# Patient Record
Sex: Male | Born: 1940 | Race: White | Hispanic: No | Marital: Married | Smoking: Never smoker
Health system: Southern US, Community
[De-identification: ages and names within clinical notes are randomized; demographics above are authoritative.]

## PROBLEM LIST (undated history)

## (undated) DIAGNOSIS — I10 Essential (primary) hypertension: Secondary | ICD-10-CM

## (undated) HISTORY — PX: MENISCUS REPAIR: SHX5179

---

## 1999-01-13 ENCOUNTER — Emergency Department (HOSPITAL_COMMUNITY): Admission: EM | Admit: 1999-01-13 | Discharge: 1999-01-13 | Payer: Self-pay | Admitting: Emergency Medicine

## 1999-01-13 ENCOUNTER — Encounter: Payer: Self-pay | Admitting: Emergency Medicine

## 2011-06-26 DIAGNOSIS — Z79899 Other long term (current) drug therapy: Secondary | ICD-10-CM | POA: Diagnosis not present

## 2011-06-26 DIAGNOSIS — I10 Essential (primary) hypertension: Secondary | ICD-10-CM | POA: Diagnosis not present

## 2011-06-26 DIAGNOSIS — Z125 Encounter for screening for malignant neoplasm of prostate: Secondary | ICD-10-CM | POA: Diagnosis not present

## 2011-07-03 DIAGNOSIS — D72819 Decreased white blood cell count, unspecified: Secondary | ICD-10-CM | POA: Diagnosis not present

## 2011-07-03 DIAGNOSIS — I10 Essential (primary) hypertension: Secondary | ICD-10-CM | POA: Diagnosis not present

## 2011-12-30 DIAGNOSIS — Z23 Encounter for immunization: Secondary | ICD-10-CM | POA: Diagnosis not present

## 2012-03-31 DIAGNOSIS — I10 Essential (primary) hypertension: Secondary | ICD-10-CM | POA: Diagnosis not present

## 2012-06-09 DIAGNOSIS — H353190 Nonexudative age-related macular degeneration, unspecified eye, stage unspecified: Secondary | ICD-10-CM | POA: Diagnosis not present

## 2012-06-09 DIAGNOSIS — H43819 Vitreous degeneration, unspecified eye: Secondary | ICD-10-CM | POA: Diagnosis not present

## 2012-08-05 DIAGNOSIS — Z125 Encounter for screening for malignant neoplasm of prostate: Secondary | ICD-10-CM | POA: Diagnosis not present

## 2012-08-05 DIAGNOSIS — Z79899 Other long term (current) drug therapy: Secondary | ICD-10-CM | POA: Diagnosis not present

## 2012-08-05 DIAGNOSIS — I10 Essential (primary) hypertension: Secondary | ICD-10-CM | POA: Diagnosis not present

## 2012-08-12 DIAGNOSIS — Z0000 Encounter for general adult medical examination without abnormal findings: Secondary | ICD-10-CM | POA: Diagnosis not present

## 2013-08-24 DIAGNOSIS — Z0000 Encounter for general adult medical examination without abnormal findings: Secondary | ICD-10-CM | POA: Diagnosis not present

## 2013-08-24 DIAGNOSIS — I10 Essential (primary) hypertension: Secondary | ICD-10-CM | POA: Diagnosis not present

## 2013-08-24 DIAGNOSIS — E785 Hyperlipidemia, unspecified: Secondary | ICD-10-CM | POA: Diagnosis not present

## 2013-08-24 DIAGNOSIS — Z23 Encounter for immunization: Secondary | ICD-10-CM | POA: Diagnosis not present

## 2013-08-24 DIAGNOSIS — Z125 Encounter for screening for malignant neoplasm of prostate: Secondary | ICD-10-CM | POA: Diagnosis not present

## 2013-11-20 ENCOUNTER — Emergency Department (HOSPITAL_COMMUNITY): Payer: Medicare Other

## 2013-11-20 ENCOUNTER — Encounter (HOSPITAL_COMMUNITY): Payer: Self-pay | Admitting: Emergency Medicine

## 2013-11-20 ENCOUNTER — Emergency Department (HOSPITAL_COMMUNITY)
Admission: EM | Admit: 2013-11-20 | Discharge: 2013-11-20 | Disposition: A | Discharge status: Home / Self Care | Payer: Medicare Other | Attending: Emergency Medicine | Admitting: Emergency Medicine

## 2013-11-20 DIAGNOSIS — IMO0002 Reserved for concepts with insufficient information to code with codable children: Secondary | ICD-10-CM | POA: Diagnosis not present

## 2013-11-20 DIAGNOSIS — Z23 Encounter for immunization: Secondary | ICD-10-CM | POA: Insufficient documentation

## 2013-11-20 DIAGNOSIS — X500XXA Overexertion from strenuous movement or load, initial encounter: Secondary | ICD-10-CM | POA: Insufficient documentation

## 2013-11-20 DIAGNOSIS — Z880 Allergy status to penicillin: Secondary | ICD-10-CM | POA: Diagnosis not present

## 2013-11-20 DIAGNOSIS — S92002A Unspecified fracture of left calcaneus, initial encounter for closed fracture: Secondary | ICD-10-CM

## 2013-11-20 DIAGNOSIS — S92009A Unspecified fracture of unspecified calcaneus, initial encounter for closed fracture: Secondary | ICD-10-CM | POA: Diagnosis not present

## 2013-11-20 DIAGNOSIS — S92919A Unspecified fracture of unspecified toe(s), initial encounter for closed fracture: Secondary | ICD-10-CM | POA: Insufficient documentation

## 2013-11-20 DIAGNOSIS — S8990XA Unspecified injury of unspecified lower leg, initial encounter: Secondary | ICD-10-CM | POA: Insufficient documentation

## 2013-11-20 DIAGNOSIS — Y9289 Other specified places as the place of occurrence of the external cause: Secondary | ICD-10-CM | POA: Diagnosis not present

## 2013-11-20 DIAGNOSIS — Z7982 Long term (current) use of aspirin: Secondary | ICD-10-CM | POA: Insufficient documentation

## 2013-11-20 DIAGNOSIS — S99919A Unspecified injury of unspecified ankle, initial encounter: Secondary | ICD-10-CM | POA: Diagnosis not present

## 2013-11-20 DIAGNOSIS — Y9389 Activity, other specified: Secondary | ICD-10-CM | POA: Diagnosis not present

## 2013-11-20 DIAGNOSIS — Z79899 Other long term (current) drug therapy: Secondary | ICD-10-CM | POA: Insufficient documentation

## 2013-11-20 DIAGNOSIS — I10 Essential (primary) hypertension: Secondary | ICD-10-CM | POA: Diagnosis not present

## 2013-11-20 DIAGNOSIS — S99929A Unspecified injury of unspecified foot, initial encounter: Secondary | ICD-10-CM

## 2013-11-20 HISTORY — DX: Essential (primary) hypertension: I10

## 2013-11-20 MED ORDER — BACITRACIN ZINC 500 UNIT/GM EX OINT
TOPICAL_OINTMENT | CUTANEOUS | Status: AC
  Filled 2013-11-20: qty 1.8

## 2013-11-20 MED ORDER — BACITRACIN 500 UNIT/GM EX OINT
1.0000 "application " | TOPICAL_OINTMENT | Freq: Two times a day (BID) | CUTANEOUS | Status: DC
  Administered 2013-11-20: 1 via TOPICAL
  Filled 2013-11-20 (×2): qty 0.9

## 2013-11-20 MED ORDER — HYDROCODONE-ACETAMINOPHEN 5-325 MG PO TABS: 1.0000 | ORAL_TABLET | ORAL | Status: AC | PRN

## 2013-11-20 MED ORDER — TETANUS-DIPHTH-ACELL PERTUSSIS 5-2.5-18.5 LF-MCG/0.5 IM SUSP
0.5000 mL | Freq: Once | INTRAMUSCULAR | Status: AC
  Administered 2013-11-20: 0.5 mL via INTRAMUSCULAR
  Filled 2013-11-20: qty 0.5

## 2013-11-20 NOTE — ED Notes (Signed)
Pt states that he fell off a ladder yesterday.  States that he "banged the top of his left foot".  C/o pain and swelling.  Pt states that he did not hit his head and is not c/o any other problems.

## 2013-11-20 NOTE — ED Notes (Signed)
Awaiting for Ortho Tech Quinton arrival. Once splint is placed will discharge patient.

## 2013-11-20 NOTE — ED Provider Notes (Signed)
CSN: [id number]     Arrival date & time 11/20/13  0732 History   First MD Initiated Contact with Patient 11/20/13 [phone]     Chief Complaint  Patient presents with  . Foot Pain     (Consider location/radiation/quality/duration/timing/severity/associated sxs/prior Treatment) HPI Comments: Patient presents with left foot pain. He states yesterday he was up on a step stool and when he was coming down, he tripped and twisted his left foot. He also bumped it on the top of the step. He's had a hard time bearing weight since the incident. It was markedly more swollen this morning. He has a scratch on his right leg. He denies any other injuries. He did not hit his head. There is no neck or back pain. He's not sure when his last tetanus shot was but feels it was more than 5 years ago.  Patient is a 73 y.o. male presenting with lower extremity pain.  Foot Pain Pertinent negatives include no headaches.    Past Medical History  Diagnosis Date  . Hypertension    Past Surgical History  Procedure Laterality Date  . Meniscus repair     History reviewed. No pertinent family history. History  Substance Use Topics  . Smoking status: Never Smoker   . Smokeless tobacco: Not on file  . Alcohol Use: 0.6 oz/week    1 Glasses of wine per week    Review of Systems  Constitutional: Negative for fever.  Gastrointestinal: Negative for nausea and vomiting.  Musculoskeletal: Positive for arthralgias and joint swelling. Negative for back pain and neck pain.  Skin: Positive for wound.  Neurological: Negative for weakness, numbness and headaches.      Allergies  Penicillins  Home Medications   Prior to Admission medications   Medication Sig Start Date End Date Taking? Authorizing Provider  aspirin EC 81 MG tablet Take 81 mg by mouth daily.   Yes Historical Provider, MD  atorvastatin (LIPITOR) 10 MG tablet Take 10 mg by mouth daily.  11/16/13  Yes Historical Provider, MD  calcium-vitamin D (OSCAL WITH  D) 500-200 MG-UNIT per tablet Take 1 tablet by mouth as directed.   Yes Historical Provider, MD  ibuprofen (ADVIL,MOTRIN) 200 MG tablet Take 200 mg by mouth every 6 (six) hours as needed.   Yes Historical Provider, MD  lisinopril-hydrochlorothiazide (PRINZIDE,ZESTORETIC) 20-12.5 MG per tablet Take 1 tablet by mouth daily.  11/16/13  Yes Historical Provider, MD  HYDROcodone-acetaminophen (NORCO/VICODIN) 5-325 MG per tablet Take 1-2 tablets by mouth every 4 (four) hours as needed for moderate pain. 11/20/13   Malvin Johns, MD   BP 145/89  Pulse 71  Temp(Src) 98 F (36.7 C) (Oral)  Resp 18  SpO2 100% Physical Exam  Constitutional: He is oriented to person, place, and time. He appears well-developed and well-nourished.  HENT:  Head: Normocephalic and atraumatic.  Neck: Normal range of motion. Neck supple.  Cardiovascular: Normal rate.   Pulmonary/Chest: Effort normal.  Musculoskeletal: He exhibits edema and tenderness.  +swelling over dorsum of left foot.  +TTP over lateral aspect of foot, no pain to ankle or knee.  Normal sensation, pulses, motor function in foot.  Small abrasion to right shin.  No bleeding, drainage or signs of infection.  No underlying bony tenderness  Neurological: He is alert and oriented to person, place, and time.  Skin: Skin is warm and dry.  Psychiatric: He has a normal mood and affect.    ED Course  Procedures (including critical care time) Labs Review Labs  Reviewed - No data to display  Imaging Review Dg Foot Complete Left  11/20/2013   CLINICAL DATA:  Injury  EXAM: LEFT FOOT - COMPLETE 3+ VIEW  COMPARISON:  None.  FINDINGS: There is a minimally displaced fracture at the base of the proximal phalanx of the great toe which extends into the metatarsophalangeal joint. It predominately involves the proximal epiphysis.  There is a comminuted fracture involving the neck and distal articular surface of the calcaneus. Fracture fragments are disorganized. The metatarsals,  and talus are grossly intact. Tibial a and fibula are grossly intact.  IMPRESSION: Acute intra-articular fracture at the base of the proximal phalanx of the great toe.  Acute intra-articular distal calcaneus fracture.   Electronically Signed   By: Maryclare Bean M.D.   On: 11/20/2013 08:18     EKG Interpretation None      MDM   Final diagnoses:  Calcaneal fracture, left, closed, initial encounter    Patient with a calcaneus fracture. There is also evidence of a fracture at the base of the proximal phalanx of the great 2. Patient has no underlying tenderness to this area. She was however placed in a posterior splint. He was advised to be nonweightbearing. He has a walker at home and was also given crutches to use as needed. I advised him the importance of orthopedic followup. I gave him a referral to the on-call orthopedic group. He was given prescription for Vicodin to use as needed for pain. He's also taking ibuprofen at home.    Malvin Johns, MD 11/20/13 [phone]

## 2013-11-20 NOTE — ED Notes (Addendum)
Some edema noted to left foot. Good strong pedal pulse present. Patient able to tolerate movement and palpation upon exam. Patient denies pain when he is not walking.

## 2013-11-20 NOTE — ED Notes (Signed)
Patient transported to XRAY by Jolayne Haines

## 2013-11-20 NOTE — ED Notes (Addendum)
Ortho paged by Scientist, research (physical sciences).

## 2013-11-20 NOTE — ED Notes (Signed)
Ortho Tech at bedside.  

## 2013-11-20 NOTE — ED Notes (Signed)
Abrasion to right anterior lower leg. Cleaned with Saline. Bacitracin applied and band-aid applied.

## 2013-11-20 NOTE — ED Notes (Signed)
MD Belfi at bedside. 

## 2013-11-24 DIAGNOSIS — S92009A Unspecified fracture of unspecified calcaneus, initial encounter for closed fracture: Secondary | ICD-10-CM | POA: Diagnosis not present

## 2013-12-08 DIAGNOSIS — S92009A Unspecified fracture of unspecified calcaneus, initial encounter for closed fracture: Secondary | ICD-10-CM | POA: Diagnosis not present

## 2013-12-29 DIAGNOSIS — S92025D Nondisplaced fracture of anterior process of left calcaneus, subsequent encounter for fracture with routine healing: Secondary | ICD-10-CM | POA: Diagnosis not present

## 2013-12-29 DIAGNOSIS — M25572 Pain in left ankle and joints of left foot: Secondary | ICD-10-CM | POA: Diagnosis not present

## 2014-02-16 DIAGNOSIS — Z85828 Personal history of other malignant neoplasm of skin: Secondary | ICD-10-CM | POA: Diagnosis not present

## 2014-02-16 DIAGNOSIS — D235 Other benign neoplasm of skin of trunk: Secondary | ICD-10-CM | POA: Diagnosis not present

## 2014-02-16 DIAGNOSIS — L821 Other seborrheic keratosis: Secondary | ICD-10-CM | POA: Diagnosis not present

## 2014-02-16 DIAGNOSIS — L570 Actinic keratosis: Secondary | ICD-10-CM | POA: Diagnosis not present

## 2014-04-28 DIAGNOSIS — E785 Hyperlipidemia, unspecified: Secondary | ICD-10-CM | POA: Diagnosis not present

## 2014-06-08 DIAGNOSIS — L820 Inflamed seborrheic keratosis: Secondary | ICD-10-CM | POA: Diagnosis not present

## 2014-07-05 DIAGNOSIS — R42 Dizziness and giddiness: Secondary | ICD-10-CM | POA: Diagnosis not present

## 2014-07-05 DIAGNOSIS — H6121 Impacted cerumen, right ear: Secondary | ICD-10-CM | POA: Diagnosis not present

## 2014-08-02 DIAGNOSIS — H26491 Other secondary cataract, right eye: Secondary | ICD-10-CM | POA: Diagnosis not present

## 2014-09-21 DIAGNOSIS — I10 Essential (primary) hypertension: Secondary | ICD-10-CM | POA: Diagnosis not present

## 2014-09-21 DIAGNOSIS — Z125 Encounter for screening for malignant neoplasm of prostate: Secondary | ICD-10-CM | POA: Diagnosis not present

## 2014-09-21 DIAGNOSIS — Z0000 Encounter for general adult medical examination without abnormal findings: Secondary | ICD-10-CM | POA: Diagnosis not present

## 2014-09-21 DIAGNOSIS — E785 Hyperlipidemia, unspecified: Secondary | ICD-10-CM | POA: Diagnosis not present

## 2014-09-21 DIAGNOSIS — Z1211 Encounter for screening for malignant neoplasm of colon: Secondary | ICD-10-CM | POA: Diagnosis not present

## 2014-09-25 DIAGNOSIS — H3530 Unspecified macular degeneration: Secondary | ICD-10-CM | POA: Diagnosis not present

## 2014-09-25 DIAGNOSIS — H26491 Other secondary cataract, right eye: Secondary | ICD-10-CM | POA: Diagnosis not present

## 2015-05-15 DIAGNOSIS — H26493 Other secondary cataract, bilateral: Secondary | ICD-10-CM | POA: Diagnosis not present

## 2015-05-15 DIAGNOSIS — H353131 Nonexudative age-related macular degeneration, bilateral, early dry stage: Secondary | ICD-10-CM | POA: Diagnosis not present

## 2015-08-17 DIAGNOSIS — L538 Other specified erythematous conditions: Secondary | ICD-10-CM | POA: Diagnosis not present

## 2015-08-17 DIAGNOSIS — L859 Epidermal thickening, unspecified: Secondary | ICD-10-CM | POA: Diagnosis not present

## 2015-10-08 DIAGNOSIS — Z0000 Encounter for general adult medical examination without abnormal findings: Secondary | ICD-10-CM | POA: Diagnosis not present

## 2015-10-08 DIAGNOSIS — I10 Essential (primary) hypertension: Secondary | ICD-10-CM | POA: Diagnosis not present

## 2015-10-08 DIAGNOSIS — Z125 Encounter for screening for malignant neoplasm of prostate: Secondary | ICD-10-CM | POA: Diagnosis not present

## 2015-10-08 DIAGNOSIS — E785 Hyperlipidemia, unspecified: Secondary | ICD-10-CM | POA: Diagnosis not present

## 2016-05-06 DIAGNOSIS — D485 Neoplasm of uncertain behavior of skin: Secondary | ICD-10-CM | POA: Diagnosis not present

## 2016-05-06 DIAGNOSIS — D1801 Hemangioma of skin and subcutaneous tissue: Secondary | ICD-10-CM | POA: Diagnosis not present

## 2016-05-19 DIAGNOSIS — Z86010 Personal history of colonic polyps: Secondary | ICD-10-CM | POA: Diagnosis not present

## 2016-05-19 DIAGNOSIS — D126 Benign neoplasm of colon, unspecified: Secondary | ICD-10-CM | POA: Diagnosis not present

## 2016-05-19 DIAGNOSIS — K5730 Diverticulosis of large intestine without perforation or abscess without bleeding: Secondary | ICD-10-CM | POA: Diagnosis not present

## 2016-05-19 DIAGNOSIS — K6289 Other specified diseases of anus and rectum: Secondary | ICD-10-CM | POA: Diagnosis not present

## 2016-05-22 DIAGNOSIS — D126 Benign neoplasm of colon, unspecified: Secondary | ICD-10-CM | POA: Diagnosis not present

## 2016-06-30 DIAGNOSIS — H353131 Nonexudative age-related macular degeneration, bilateral, early dry stage: Secondary | ICD-10-CM | POA: Diagnosis not present

## 2016-08-11 IMAGING — CR DG FOOT COMPLETE 3+V*L*
3 series · 3 of 3 positions shown · non-contrast
Comparison: None.

CLINICAL DATA: Injury

EXAM:
LEFT FOOT - COMPLETE 3+ VIEW

[x foot ap left]
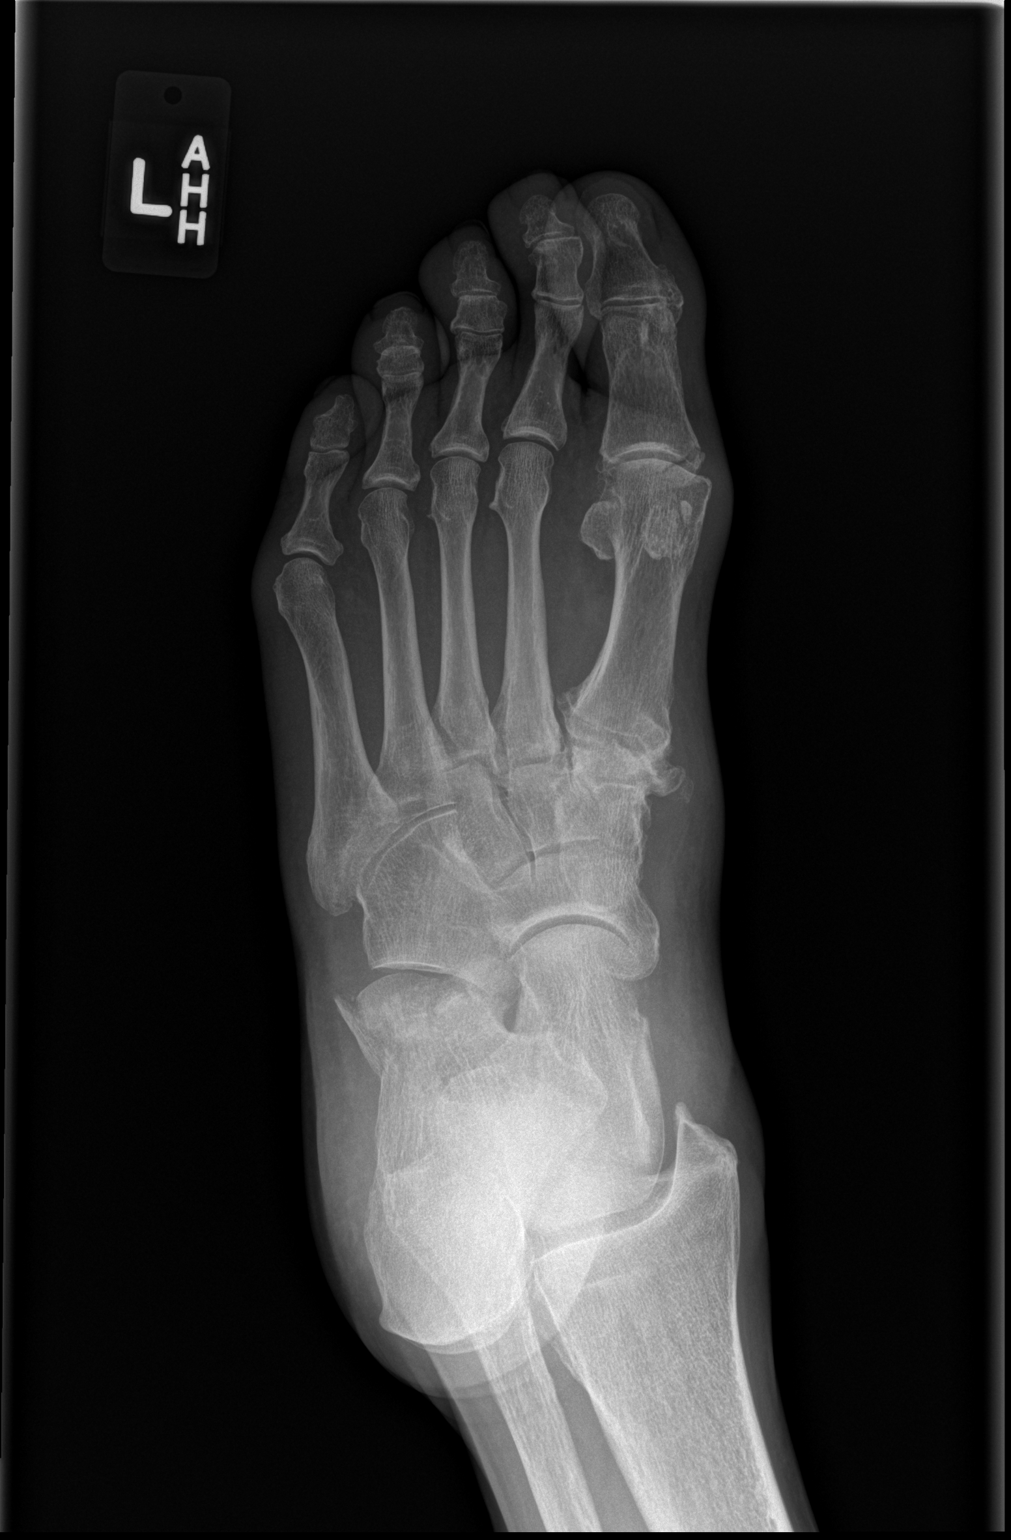

[x foot obl left]
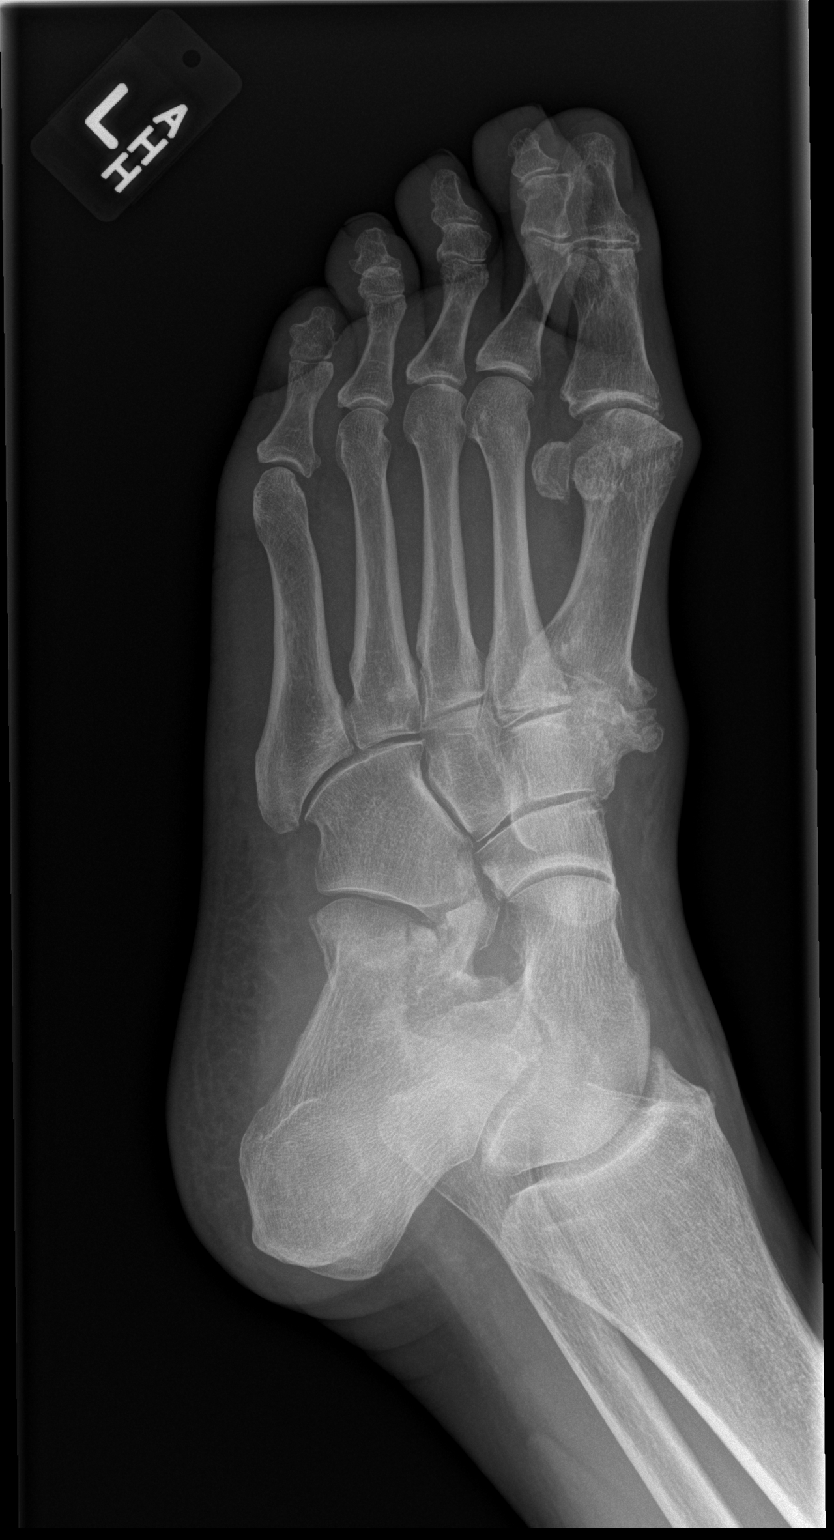

[x foot lat left]
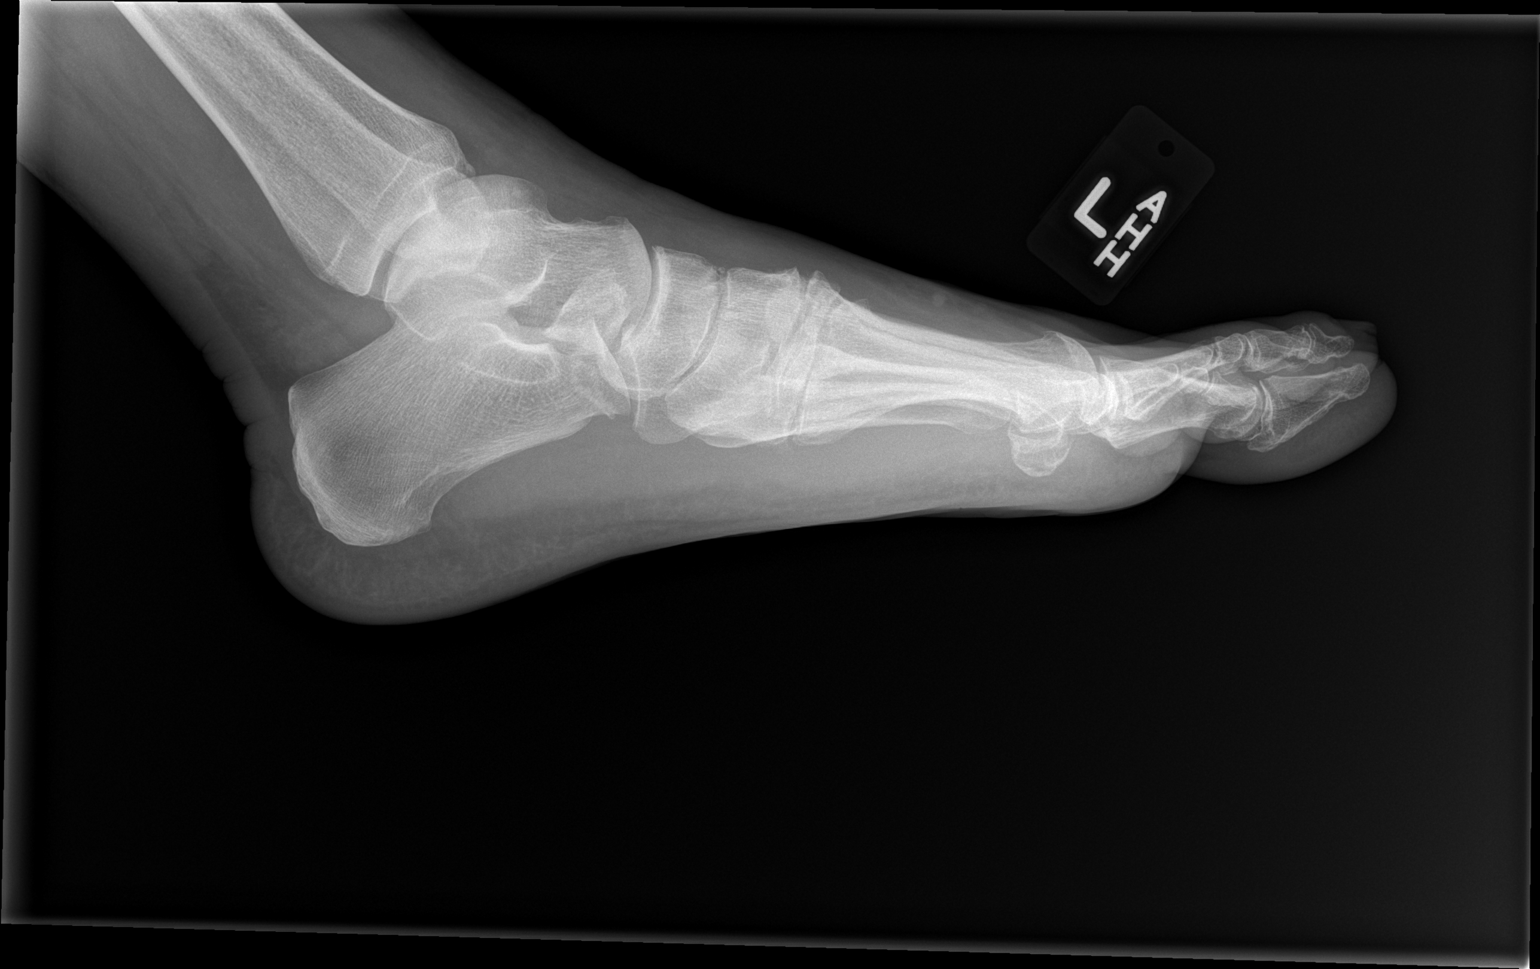

[3 of 3 positions shown; findings below may reference images not displayed]

FINDINGS: There is a minimally displaced fracture at the base of the proximal
phalanx of the great toe which extends into the metatarsophalangeal
joint. It predominately involves the proximal epiphysis.

There is a comminuted fracture involving the neck and distal
articular surface of the calcaneus. Fracture fragments are
disorganized. The metatarsals, and talus are grossly intact. Tibial
a and fibula are grossly intact.
IMPRESSION: Acute intra-articular fracture at the base of the proximal phalanx
of the great toe.

Acute intra-articular distal calcaneus fracture.

## 2016-10-10 DIAGNOSIS — I10 Essential (primary) hypertension: Secondary | ICD-10-CM | POA: Diagnosis not present

## 2016-10-10 DIAGNOSIS — R7309 Other abnormal glucose: Secondary | ICD-10-CM | POA: Diagnosis not present

## 2016-10-10 DIAGNOSIS — Z125 Encounter for screening for malignant neoplasm of prostate: Secondary | ICD-10-CM | POA: Diagnosis not present

## 2016-10-10 DIAGNOSIS — Z0000 Encounter for general adult medical examination without abnormal findings: Secondary | ICD-10-CM | POA: Diagnosis not present

## 2016-10-10 DIAGNOSIS — E785 Hyperlipidemia, unspecified: Secondary | ICD-10-CM | POA: Diagnosis not present

## 2017-07-27 DIAGNOSIS — H353131 Nonexudative age-related macular degeneration, bilateral, early dry stage: Secondary | ICD-10-CM | POA: Diagnosis not present

## 2017-10-20 DIAGNOSIS — R7309 Other abnormal glucose: Secondary | ICD-10-CM | POA: Diagnosis not present

## 2017-10-20 DIAGNOSIS — E785 Hyperlipidemia, unspecified: Secondary | ICD-10-CM | POA: Diagnosis not present

## 2017-10-20 DIAGNOSIS — Z0000 Encounter for general adult medical examination without abnormal findings: Secondary | ICD-10-CM | POA: Diagnosis not present

## 2017-10-20 DIAGNOSIS — I10 Essential (primary) hypertension: Secondary | ICD-10-CM | POA: Diagnosis not present

## 2017-10-20 DIAGNOSIS — Z125 Encounter for screening for malignant neoplasm of prostate: Secondary | ICD-10-CM | POA: Diagnosis not present

## 2017-12-17 DIAGNOSIS — Z23 Encounter for immunization: Secondary | ICD-10-CM | POA: Diagnosis not present

## 2018-11-11 DIAGNOSIS — Z0000 Encounter for general adult medical examination without abnormal findings: Secondary | ICD-10-CM | POA: Diagnosis not present

## 2018-11-11 DIAGNOSIS — E785 Hyperlipidemia, unspecified: Secondary | ICD-10-CM | POA: Diagnosis not present

## 2018-11-11 DIAGNOSIS — I10 Essential (primary) hypertension: Secondary | ICD-10-CM | POA: Diagnosis not present

## 2018-11-11 DIAGNOSIS — Z125 Encounter for screening for malignant neoplasm of prostate: Secondary | ICD-10-CM | POA: Diagnosis not present

## 2018-11-11 DIAGNOSIS — R7309 Other abnormal glucose: Secondary | ICD-10-CM | POA: Diagnosis not present

## 2019-12-07 DIAGNOSIS — Z0000 Encounter for general adult medical examination without abnormal findings: Secondary | ICD-10-CM | POA: Diagnosis not present

## 2019-12-07 DIAGNOSIS — I10 Essential (primary) hypertension: Secondary | ICD-10-CM | POA: Diagnosis not present

## 2019-12-07 DIAGNOSIS — R7309 Other abnormal glucose: Secondary | ICD-10-CM | POA: Diagnosis not present

## 2019-12-07 DIAGNOSIS — E785 Hyperlipidemia, unspecified: Secondary | ICD-10-CM | POA: Diagnosis not present

## 2020-12-25 DIAGNOSIS — E785 Hyperlipidemia, unspecified: Secondary | ICD-10-CM | POA: Diagnosis not present

## 2020-12-25 DIAGNOSIS — Z0000 Encounter for general adult medical examination without abnormal findings: Secondary | ICD-10-CM | POA: Diagnosis not present

## 2020-12-25 DIAGNOSIS — I10 Essential (primary) hypertension: Secondary | ICD-10-CM | POA: Diagnosis not present

## 2020-12-25 DIAGNOSIS — R7309 Other abnormal glucose: Secondary | ICD-10-CM | POA: Diagnosis not present

## 2022-01-14 DIAGNOSIS — R7309 Other abnormal glucose: Secondary | ICD-10-CM | POA: Diagnosis not present

## 2022-01-14 DIAGNOSIS — Z0000 Encounter for general adult medical examination without abnormal findings: Secondary | ICD-10-CM | POA: Diagnosis not present

## 2022-01-14 DIAGNOSIS — E785 Hyperlipidemia, unspecified: Secondary | ICD-10-CM | POA: Diagnosis not present

## 2022-01-14 DIAGNOSIS — I10 Essential (primary) hypertension: Secondary | ICD-10-CM | POA: Diagnosis not present

## 2022-03-28 DIAGNOSIS — U071 COVID-19: Secondary | ICD-10-CM | POA: Diagnosis not present

## 2023-01-22 DIAGNOSIS — Z5181 Encounter for therapeutic drug level monitoring: Secondary | ICD-10-CM | POA: Diagnosis not present

## 2023-01-22 DIAGNOSIS — R7309 Other abnormal glucose: Secondary | ICD-10-CM | POA: Diagnosis not present

## 2023-01-22 DIAGNOSIS — Z0000 Encounter for general adult medical examination without abnormal findings: Secondary | ICD-10-CM | POA: Diagnosis not present

## 2023-01-22 DIAGNOSIS — E785 Hyperlipidemia, unspecified: Secondary | ICD-10-CM | POA: Diagnosis not present

## 2023-01-22 DIAGNOSIS — I10 Essential (primary) hypertension: Secondary | ICD-10-CM | POA: Diagnosis not present

## 2023-01-26 DIAGNOSIS — H353131 Nonexudative age-related macular degeneration, bilateral, early dry stage: Secondary | ICD-10-CM | POA: Diagnosis not present

## 2023-07-15 DIAGNOSIS — E785 Hyperlipidemia, unspecified: Secondary | ICD-10-CM | POA: Diagnosis not present

## 2023-07-15 DIAGNOSIS — I10 Essential (primary) hypertension: Secondary | ICD-10-CM | POA: Diagnosis not present

## 2023-08-15 DIAGNOSIS — E785 Hyperlipidemia, unspecified: Secondary | ICD-10-CM | POA: Diagnosis not present

## 2023-08-15 DIAGNOSIS — I10 Essential (primary) hypertension: Secondary | ICD-10-CM | POA: Diagnosis not present

## 2023-12-17 DIAGNOSIS — L570 Actinic keratosis: Secondary | ICD-10-CM | POA: Diagnosis not present

## 2023-12-17 DIAGNOSIS — Z85828 Personal history of other malignant neoplasm of skin: Secondary | ICD-10-CM | POA: Diagnosis not present

## 2023-12-17 DIAGNOSIS — L609 Nail disorder, unspecified: Secondary | ICD-10-CM | POA: Diagnosis not present

## 2023-12-17 DIAGNOSIS — L820 Inflamed seborrheic keratosis: Secondary | ICD-10-CM | POA: Diagnosis not present

## 2023-12-17 DIAGNOSIS — Z08 Encounter for follow-up examination after completed treatment for malignant neoplasm: Secondary | ICD-10-CM | POA: Diagnosis not present

## 2023-12-17 DIAGNOSIS — L538 Other specified erythematous conditions: Secondary | ICD-10-CM | POA: Diagnosis not present

## 2023-12-17 DIAGNOSIS — R208 Other disturbances of skin sensation: Secondary | ICD-10-CM | POA: Diagnosis not present

## 2023-12-17 DIAGNOSIS — L578 Other skin changes due to chronic exposure to nonionizing radiation: Secondary | ICD-10-CM | POA: Diagnosis not present

## 2024-01-28 DIAGNOSIS — L578 Other skin changes due to chronic exposure to nonionizing radiation: Secondary | ICD-10-CM | POA: Diagnosis not present

## 2024-01-28 DIAGNOSIS — L570 Actinic keratosis: Secondary | ICD-10-CM | POA: Diagnosis not present

## 2024-01-28 DIAGNOSIS — L609 Nail disorder, unspecified: Secondary | ICD-10-CM | POA: Diagnosis not present
